# Patient Record
Sex: Female | Born: 2005 | Race: White | Hispanic: No | Marital: Single | Smoking: Never smoker
Health system: Southern US, Community
[De-identification: ages and names within clinical notes are randomized; demographics above are authoritative.]

## PROBLEM LIST (undated history)

## (undated) DIAGNOSIS — R1110 Vomiting, unspecified: Secondary | ICD-10-CM

## (undated) DIAGNOSIS — K219 Gastro-esophageal reflux disease without esophagitis: Secondary | ICD-10-CM

## (undated) HISTORY — DX: Vomiting, unspecified: R11.10

## (undated) HISTORY — DX: Gastro-esophageal reflux disease without esophagitis: K21.9

## (undated) HISTORY — PX: TONSILLECTOMY: SUR1361

## (undated) HISTORY — PX: ADENOIDECTOMY: SUR15

---
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
## 2024-05-16 ENCOUNTER — Encounter: Payer: Self-pay | Admitting: Family Medicine

## 2024-05-20 NOTE — Telephone Encounter (Signed)
 Called Pt and she was able to print from her Mychart.

## 2024-06-24 ENCOUNTER — Other Ambulatory Visit: Payer: Self-pay | Admitting: Family Medicine

## 2024-06-24 DIAGNOSIS — G43009 Migraine without aura, not intractable, without status migrainosus: Secondary | ICD-10-CM
